# Patient Record
Sex: Male | Born: 1975 | Race: Black or African American | Hispanic: No | Marital: Single | Smoking: Current every day smoker
Health system: Southern US, Community
[De-identification: ages and names within clinical notes are randomized; demographics above are authoritative.]

---

## 2004-01-16 ENCOUNTER — Emergency Department: Payer: Self-pay | Admitting: Emergency Medicine

## 2006-09-27 ENCOUNTER — Emergency Department: Payer: Self-pay | Admitting: Emergency Medicine

## 2009-01-24 ENCOUNTER — Ambulatory Visit: Payer: Self-pay

## 2009-12-14 ENCOUNTER — Emergency Department: Payer: Self-pay | Admitting: Emergency Medicine

## 2012-06-27 ENCOUNTER — Emergency Department: Payer: Self-pay | Admitting: Emergency Medicine

## 2014-11-14 ENCOUNTER — Encounter: Payer: Self-pay | Admitting: Emergency Medicine

## 2014-11-14 ENCOUNTER — Emergency Department
Admission: EM | Admit: 2014-11-14 | Discharge: 2014-11-14 | Disposition: A | Discharge status: Home / Self Care | Payer: BLUE CROSS/BLUE SHIELD | Attending: Emergency Medicine | Admitting: Emergency Medicine

## 2014-11-14 ENCOUNTER — Emergency Department: Payer: BLUE CROSS/BLUE SHIELD

## 2014-11-14 DIAGNOSIS — Z720 Tobacco use: Secondary | ICD-10-CM | POA: Diagnosis not present

## 2014-11-14 DIAGNOSIS — W228XXA Striking against or struck by other objects, initial encounter: Secondary | ICD-10-CM | POA: Diagnosis not present

## 2014-11-14 DIAGNOSIS — Y906 Blood alcohol level of 120-199 mg/100 ml: Secondary | ICD-10-CM | POA: Diagnosis not present

## 2014-11-14 DIAGNOSIS — Y9289 Other specified places as the place of occurrence of the external cause: Secondary | ICD-10-CM | POA: Diagnosis not present

## 2014-11-14 DIAGNOSIS — S0003XA Contusion of scalp, initial encounter: Secondary | ICD-10-CM

## 2014-11-14 DIAGNOSIS — F10120 Alcohol abuse with intoxication, uncomplicated: Secondary | ICD-10-CM | POA: Diagnosis not present

## 2014-11-14 DIAGNOSIS — Y9389 Activity, other specified: Secondary | ICD-10-CM | POA: Insufficient documentation

## 2014-11-14 DIAGNOSIS — S0083XA Contusion of other part of head, initial encounter: Secondary | ICD-10-CM

## 2014-11-14 DIAGNOSIS — Y998 Other external cause status: Secondary | ICD-10-CM | POA: Diagnosis not present

## 2014-11-14 DIAGNOSIS — R51 Headache: Secondary | ICD-10-CM | POA: Diagnosis present

## 2014-11-14 DIAGNOSIS — F10220 Alcohol dependence with intoxication, uncomplicated: Secondary | ICD-10-CM

## 2014-11-14 LAB — BASIC METABOLIC PANEL
ANION GAP: 11 (ref 5–15)
BUN: 23 mg/dL — ABNORMAL HIGH (ref 6–20)
CO2: 25 mmol/L (ref 22–32)
Calcium: 8.6 mg/dL — ABNORMAL LOW (ref 8.9–10.3)
Chloride: 106 mmol/L (ref 101–111)
Creatinine, Ser: 1.12 mg/dL (ref 0.61–1.24)
GFR calc Af Amer: >60 mL/min (ref >60)
GFR calc non Af Amer: >60 mL/min (ref >60)
Glucose, Bld: 141 mg/dL — ABNORMAL HIGH (ref 65–99)
POTASSIUM: 3.5 mmol/L (ref 3.5–5.1)
SODIUM: 142 mmol/L (ref 135–145)

## 2014-11-14 LAB — URINALYSIS COMPLETE WITH MICROSCOPIC (ARMC ONLY)
BACTERIA UA: NONE SEEN
BILIRUBIN URINE: NEGATIVE
Glucose, UA: NEGATIVE mg/dL
Leukocytes, UA: NEGATIVE
NITRITE: NEGATIVE
PROTEIN: NEGATIVE mg/dL
RBC / HPF: NONE SEEN RBC/hpf (ref 0–5)
SPECIFIC GRAVITY, URINE: 1.021 (ref 1.005–1.030)
pH: 5 (ref 5.0–8.0)

## 2014-11-14 LAB — URINE DRUG SCREEN, QUALITATIVE (ARMC ONLY)
AMPHETAMINES, UR SCREEN: NOT DETECTED
Barbiturates, Ur Screen: NOT DETECTED
Benzodiazepine, Ur Scrn: NOT DETECTED
Cannabinoid 50 Ng, Ur ~~LOC~~: NOT DETECTED
Cocaine Metabolite,Ur ~~LOC~~: NOT DETECTED
MDMA (Ecstasy)Ur Screen: NOT DETECTED
METHADONE SCREEN, URINE: NOT DETECTED
Opiate, Ur Screen: NOT DETECTED
Phencyclidine (PCP) Ur S: NOT DETECTED
TRICYCLIC, UR SCREEN: NOT DETECTED

## 2014-11-14 LAB — CBC WITH DIFFERENTIAL/PLATELET
BASOS ABS: 0 10*3/uL (ref 0–0.1)
Basophils Relative: 1 %
EOS ABS: 0 10*3/uL (ref 0–0.7)
Eosinophils Relative: 0 %
HEMATOCRIT: 38.5 % — AB (ref 40.0–52.0)
Hemoglobin: 13.2 g/dL (ref 13.0–18.0)
LYMPHS ABS: 1.3 10*3/uL (ref 1.0–3.6)
Lymphocytes Relative: 19 %
MCH: 32.1 pg (ref 26.0–34.0)
MCHC: 34.2 g/dL (ref 32.0–36.0)
MCV: 93.7 fL (ref 80.0–100.0)
Monocytes Absolute: 0.2 10*3/uL (ref 0.2–1.0)
Monocytes Relative: 3 %
Neutro Abs: 5.6 10*3/uL (ref 1.4–6.5)
Neutrophils Relative %: 77 %
PLATELETS: 246 10*3/uL (ref 150–440)
RBC: 4.11 MIL/uL — ABNORMAL LOW (ref 4.40–5.90)
RDW: 13.5 % (ref 11.5–14.5)
WBC: 7.2 10*3/uL (ref 3.8–10.6)

## 2014-11-14 LAB — ETHANOL: Alcohol, Ethyl (B): 198 mg/dL — ABNORMAL HIGH (ref <5)

## 2014-11-14 MED ORDER — SODIUM CHLORIDE 0.9 % IV BOLUS (SEPSIS)
1000.0000 mL | Freq: Once | INTRAVENOUS | Status: AC
  Administered 2014-11-14: 1000 mL via INTRAVENOUS

## 2014-11-14 NOTE — ED Provider Notes (Signed)
[facility] Emergency Department Provider Note  ____________________________________________  Time seen:  1:33 PM  I have reviewed the triage vital signs and the nursing notes.   HISTORY  Chief Complaint Headache   HPI Jared Carrillo is a 39 y.o. male is here with complaint of headache. History is partially given by a male visitor. Patient states that he hit his head last night but cannot tell what he hit his head on or why he hit his head on anything. He is unaware if he tripped, fell, or had loss of consciousness. He does recall drinking but is unaware of how much he drank. He states today he continues to have a headache. He reports being unable to drive quickly today or perform any of his normal functions. His friend states that he is having trouble waking this morning. Patient verbalized he still feels drunk at this time. He does have an abrasion to his right forehead without active bleeding. He states there is some neck pain involved as well as his headache. He denies any paresthesias to his extremities. Currently he complains of some visual difficulty but is unable to verbalize exactly what he means. He rates his headache pain as 7 out of 10.   History reviewed. No pertinent past medical history.  There are no active problems to display for this patient.   History reviewed. No pertinent past surgical history.  No current outpatient prescriptions on file.  Allergies Review of patient's allergies indicates not on file.  History reviewed. No pertinent family history.  Social History History  Substance Use Topics  . Smoking status: Current Every Day Smoker  . Smokeless tobacco: Not on file  . Alcohol Use: Not on file    Review of Systems Constitutional: No fever/chills Eyes: Positive visual changes. ENT: No sore throat. Cardiovascular: Denies chest pain. Respiratory: Denies shortness of breath. Gastrointestinal: No abdominal pain.  No nausea,  no vomiting.  Genitourinary: Negative for dysuria. Musculoskeletal: Negative for back pain. Positive neck pain Skin: Negative for rash. Positive abrasion Neurological: Positive for headaches, no focal weakness or numbness.  10-point ROS otherwise negative.  ____________________________________________   PHYSICAL EXAM:  VITAL SIGNS: ED Triage Vitals  Enc Vitals Group     BP 11/14/14 1238 107/57 mmHg     Pulse Rate 11/14/14 1238 83     Resp 11/14/14 1238 18     Temp 11/14/14 1238 97.9 F (36.6 C)     Temp Source 11/14/14 1238 Oral     SpO2 11/14/14 1238 94 %     Weight 11/14/14 1238 160 lb (72.576 kg)     Height 11/14/14 1238  (1.727 m)     Head Cir --      Peak Flow --      Pain Score 11/14/14 1251 7     Pain Loc --      Pain Edu? --      Excl. in GC? --     Constitutional: Alert and oriented. Well appearing and in no acute distress. Eyes: Conjunctivae are normal. PERRL. EOMI. Head: Atraumatic. Nose: No congestion/rhinnorhea. Mouth/Throat: Mucous membranes are moist.   Neck: No stridor.  There is some posterior cervical tenderness on palpation. Hematological/Lymphatic/Immunilogical: No cervical lymphadenopathy. Cardiovascular: Normal rate, regular rhythm. Grossly normal heart sounds.  Good peripheral circulation. Respiratory: Normal respiratory effort.  No retractions. Lungs CTAB. Gastrointestinal: Soft and nontender. No distention. No abdominal bruits. No CVA tenderness. Musculoskeletal: Moves upper extremities without any restriction. No lower extremity tenderness  nor edema.  No joint effusions. No trauma noted to the lower extremities. Neurologic:  Normal speech and language. No gross focal neurologic deficits are appreciated. Gait was not tested. Skin:  Skin is warm. There is a superficial abrasion to the right forehead and upper cheek area without active bleeding. Psychiatric: Mood and affect are normal. Speech and behavior are  normal.  ____________________________________________   LABS (all labs ordered are listed, but only abnormal results are displayed)  Labs Reviewed  BASIC METABOLIC PANEL - Abnormal; Notable for the following:    Glucose, Bld 141 (*)    BUN 23 (*)    Calcium 8.6 (*)    All other components within normal limits  CBC WITH DIFFERENTIAL/PLATELET - Abnormal; Notable for the following:    RBC 4.11 (*)    HCT 38.5 (*)    All other components within normal limits  ETHANOL - Abnormal; Notable for the following:    Alcohol, Ethyl (B) 198 (*)    All other components within normal limits  URINALYSIS COMPLETEWITH MICROSCOPIC ([facility] ONLY) - Abnormal; Notable for the following:    Color, Urine YELLOW (*)    APPearance CLEAR (*)    Ketones, ur 1+ (*)    Hgb urine dipstick 1+ (*)    Squamous Epithelial / LPF 0-5 (*)    All other components within normal limits  URINE DRUG SCREEN, QUALITATIVE ([facility] ONLY)      RADIOLOGY  CT scan of the head per radiologist negative for intracranial abnormalities CT scan cervical spine per radiologist showed degenerative changes without acute bony injury ____________________________________________   PROCEDURES  Procedure(s) performed: None  Critical Care performed: No  ____________________________________________   INITIAL IMPRESSION / ASSESSMENT AND PLAN / ED COURSE  Pertinent labs & imaging results that were available during my care of the patient were reviewed by me and considered in my medical decision making (see chart for details).  IV fluids were started on the patient. Patient was informed that his blood alcohol was 198. Urine drug screen was negative. Patient was informed that blood alcohols this time was probably fatally high at the time that he was drinking. He and his male friend were informed that his head CT did not show any acute injury. ____________________________________________   FINAL CLINICAL IMPRESSION(S) / ED  DIAGNOSES  Final diagnoses:  Facial contusion, initial encounter  Contusion of scalp, initial encounter  Alcohol intoxication with blood level 0.08-0.29, uncomplicated      Tommi Rumps, PA-C 11/14/14 1620

## 2014-11-14 NOTE — Discharge Instructions (Signed)
Alcohol Intoxication Alcohol intoxication occurs when you drink enough alcohol that it affects your ability to function. It can be mild or very severe. Drinking a lot of alcohol in a short time is called binge drinking. This can be very harmful. Drinking alcohol can also be more dangerous if you are taking medicines or other drugs. Some of the effects caused by alcohol may include:  Loss of coordination.  Changes in mood and behavior.  Unclear thinking.  Trouble talking (slurred speech).  Throwing up (vomiting).  Confusion.  Slowed breathing.  Twitching and shaking (seizures).  Loss of consciousness. HOME CARE  Do not drive after drinking alcohol.  Drink enough water and fluids to keep your pee (urine) clear or pale yellow. Avoid caffeine.  Only take medicine as told by your doctor. GET HELP IF:  You throw up (vomit) many times.  You do not feel better after a few days.  You frequently have alcohol intoxication. Your doctor can help decide if you should see a substance use treatment counselor. GET HELP RIGHT AWAY IF:  You become shaky when you stop drinking.  You have twitching and shaking.  You throw up blood. It may look bright red or like coffee grounds.  You notice blood in your poop (bowel movements).  You become lightheaded or pass out (faint). MAKE SURE YOU:   Understand these instructions.  Will watch your condition.  Will get help right away if you are not doing well or get worse. Document Released: 09/19/2007 Document Revised: 12/03/2012 Document Reviewed: 09/05/2012 [facility] Patient Information 2015 [place], [place]. This information is not intended to replace advice given to you by your health care provider. Make sure you discuss any questions you have with your health care provider.   DO NOT DRINK ANY ALCOHOL.   INCREASE FLUIDS THE REST OF THE DAY WATCH ABRASIONS FOR INFECTION.  YOU WILL NEED TO CLEAN THESE AREAS EVERY DAY WITH MILD SOAP
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
## 2014-11-14 NOTE — ED Notes (Signed)
Pt informed of need for urine specimen  

## 2014-11-14 NOTE — ED Notes (Signed)
Pt reports he fell last night and hit head, reports headache and neck pain.

## 2016-01-21 ENCOUNTER — Emergency Department: Payer: BLUE CROSS/BLUE SHIELD

## 2016-01-21 ENCOUNTER — Encounter: Payer: Self-pay | Admitting: Emergency Medicine

## 2016-01-21 ENCOUNTER — Emergency Department
Admission: EM | Admit: 2016-01-21 | Discharge: 2016-01-21 | Disposition: A | Discharge status: Home / Self Care | Payer: BLUE CROSS/BLUE SHIELD | Attending: Student | Admitting: Student

## 2016-01-21 DIAGNOSIS — Y939 Activity, unspecified: Secondary | ICD-10-CM | POA: Diagnosis not present

## 2016-01-21 DIAGNOSIS — F17200 Nicotine dependence, unspecified, uncomplicated: Secondary | ICD-10-CM | POA: Diagnosis not present

## 2016-01-21 DIAGNOSIS — F10120 Alcohol abuse with intoxication, uncomplicated: Secondary | ICD-10-CM | POA: Insufficient documentation

## 2016-01-21 DIAGNOSIS — Y929 Unspecified place or not applicable: Secondary | ICD-10-CM | POA: Insufficient documentation

## 2016-01-21 DIAGNOSIS — S2232XA Fracture of one rib, left side, initial encounter for closed fracture: Secondary | ICD-10-CM

## 2016-01-21 DIAGNOSIS — Y999 Unspecified external cause status: Secondary | ICD-10-CM | POA: Insufficient documentation

## 2016-01-21 DIAGNOSIS — S29001A Unspecified injury of muscle and tendon of front wall of thorax, initial encounter: Secondary | ICD-10-CM | POA: Diagnosis present

## 2016-01-21 DIAGNOSIS — Z23 Encounter for immunization: Secondary | ICD-10-CM | POA: Insufficient documentation

## 2016-01-21 DIAGNOSIS — S0990XA Unspecified injury of head, initial encounter: Secondary | ICD-10-CM | POA: Insufficient documentation

## 2016-01-21 DIAGNOSIS — S0181XA Laceration without foreign body of other part of head, initial encounter: Secondary | ICD-10-CM | POA: Insufficient documentation

## 2016-01-21 DIAGNOSIS — F10920 Alcohol use, unspecified with intoxication, uncomplicated: Secondary | ICD-10-CM

## 2016-01-21 LAB — CBC WITH DIFFERENTIAL/PLATELET
BASOS ABS: 0 10*3/uL (ref 0–0.1)
BASOS PCT: 0 %
Eosinophils Absolute: 0 10*3/uL (ref 0–0.7)
Eosinophils Relative: 0 %
HEMATOCRIT: 38.4 % — AB (ref 40.0–52.0)
HEMOGLOBIN: 13.3 g/dL (ref 13.0–18.0)
LYMPHS PCT: 29 %
Lymphs Abs: 2.8 10*3/uL (ref 1.0–3.6)
MCH: 33.3 pg (ref 26.0–34.0)
MCHC: 34.6 g/dL (ref 32.0–36.0)
MCV: 96.2 fL (ref 80.0–100.0)
Monocytes Absolute: 0.5 10*3/uL (ref 0.2–1.0)
Monocytes Relative: 5 %
NEUTROS ABS: 6.4 10*3/uL (ref 1.4–6.5)
NEUTROS PCT: 66 %
Platelets: 322 10*3/uL (ref 150–440)
RBC: 3.99 MIL/uL — AB (ref 4.40–5.90)
RDW: 13.5 % (ref 11.5–14.5)
WBC: 9.8 10*3/uL (ref 3.8–10.6)

## 2016-01-21 LAB — COMPREHENSIVE METABOLIC PANEL
ALT: 20 U/L (ref 17–63)
AST: 39 U/L (ref 15–41)
Albumin: 4 g/dL (ref 3.5–5.0)
Alkaline Phosphatase: 56 U/L (ref 38–126)
Anion gap: 12 (ref 5–15)
BILIRUBIN TOTAL: 0.6 mg/dL (ref 0.3–1.2)
BUN: 13 mg/dL (ref 6–20)
CO2: 22 mmol/L (ref 22–32)
CREATININE: 1.32 mg/dL — AB (ref 0.61–1.24)
Calcium: 8.6 mg/dL — ABNORMAL LOW (ref 8.9–10.3)
Chloride: 103 mmol/L (ref 101–111)
GFR calc Af Amer: >60 mL/min (ref >60)
GFR calc non Af Amer: >60 mL/min (ref >60)
Glucose, Bld: 94 mg/dL (ref 65–99)
Potassium: 3.3 mmol/L — ABNORMAL LOW (ref 3.5–5.1)
Sodium: 137 mmol/L (ref 135–145)
TOTAL PROTEIN: 7.1 g/dL (ref 6.5–8.1)

## 2016-01-21 LAB — ETHANOL: Alcohol, Ethyl (B): 292 mg/dL — ABNORMAL HIGH (ref <5)

## 2016-01-21 MED ORDER — TETANUS-DIPHTH-ACELL PERTUSSIS 5-2.5-18.5 LF-MCG/0.5 IM SUSP
0.5000 mL | Freq: Once | INTRAMUSCULAR | Status: AC
  Administered 2016-01-21: 0.5 mL via INTRAMUSCULAR
  Filled 2016-01-21: qty 0.5

## 2016-01-21 MED ORDER — OXYCODONE HCL 5 MG PO TABS: 5.0000 mg | ORAL_TABLET | Freq: Four times a day (QID) | ORAL | 0 refills | Status: DC | PRN

## 2016-01-21 MED ORDER — BACITRACIN ZINC 500 UNIT/GM EX OINT
TOPICAL_OINTMENT | CUTANEOUS | Status: AC
  Administered 2016-01-21: 1 via TOPICAL
  Filled 2016-01-21: qty 0.9

## 2016-01-21 MED ORDER — BACITRACIN ZINC 500 UNIT/GM EX OINT
TOPICAL_OINTMENT | Freq: Once | CUTANEOUS | Status: AC
  Administered 2016-01-21: 1 via TOPICAL

## 2016-01-21 MED ORDER — LIDOCAINE-EPINEPHRINE (PF) 1 %-1:200000 IJ SOLN
INTRAMUSCULAR | Status: AC
  Administered 2016-01-21: 30 mL
  Filled 2016-01-21: qty 30

## 2016-01-21 MED ORDER — OXYCODONE HCL 5 MG PO TABS
5.0000 mg | ORAL_TABLET | Freq: Once | ORAL | Status: AC
  Administered 2016-01-21: 5 mg via ORAL
  Filled 2016-01-21: qty 1
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
## 2016-04-23 ENCOUNTER — Emergency Department
Admission: EM | Admit: 2016-04-23 | Discharge: 2016-04-23 | Disposition: A | Discharge status: Home / Self Care | Payer: BLUE CROSS/BLUE SHIELD | Attending: Emergency Medicine | Admitting: Emergency Medicine

## 2016-04-23 ENCOUNTER — Encounter: Payer: Self-pay | Admitting: Emergency Medicine

## 2016-04-23 DIAGNOSIS — F17200 Nicotine dependence, unspecified, uncomplicated: Secondary | ICD-10-CM | POA: Diagnosis not present

## 2016-04-23 DIAGNOSIS — B9789 Other viral agents as the cause of diseases classified elsewhere: Secondary | ICD-10-CM

## 2016-04-23 DIAGNOSIS — R509 Fever, unspecified: Secondary | ICD-10-CM | POA: Diagnosis present

## 2016-04-23 DIAGNOSIS — J069 Acute upper respiratory infection, unspecified: Secondary | ICD-10-CM | POA: Diagnosis not present

## 2016-04-23 NOTE — ED Provider Notes (Signed)
[facility] Emergency Department Provider Note   ____________________________________________   None    (approximate)  I have reviewed the triage vital signs and the nursing notes.   HISTORY  Chief Complaint Fever    HPI Jared Carrillo is a 41 y.o. male is here with complaint of fever and body aches that started week and half to 2 weeks ago. Patient states that he has had "low-grade temp". He denies any nausea, vomiting or diarrhea. Patient denies any ear pain or throat pain. Patient states he has had some congestion and is currently taking Mucinex, Tylenol. He begin taking NyQuil and DayQuil yesterday. He states more than when he first woke up after taking NyQuil last night he felt slightly better. He also states that he has some cough which is now cleared. Patient continues to smoke one half pack cigarettes per day. He denies any previous asthma, bronchitis or pneumonia. Currently he rates his pain as 6/10.   History reviewed. No pertinent past medical history.  There are no active problems to display for this patient.   History reviewed. No pertinent surgical history.  Prior to Admission medications   Not on File    Allergies Patient has no known allergies.  No family history on file.  Social History Social History  Substance Use Topics  . Smoking status: Current Every Day Smoker  . Smokeless tobacco: Never Used  . Alcohol use Yes    Review of Systems Constitutional: Positive fever/chills Eyes: No visual changes. ENT: No sore throat. Cardiovascular: Denies chest pain. Respiratory: Denies shortness of breath. Positive cough. Gastrointestinal: No abdominal pain.  No nausea, no vomiting.  No diarrhea.  No constipation. Genitourinary: Negative for dysuria. Musculoskeletal: Generalized muscle aches. Skin: Negative for rash. Neurological: Negative for headaches, focal weakness or numbness.  10-point ROS otherwise  negative.  ____________________________________________   PHYSICAL EXAM:  VITAL SIGNS: ED Triage Vitals  Enc Vitals Group     BP 04/23/16 0931 120/70     Pulse Rate 04/23/16 0931 84     Resp 04/23/16 0931 16     Temp 04/23/16 0931 99.1 F (37.3 C)     Temp Source 04/23/16 0931 Oral     SpO2 04/23/16 0931 97 %     Weight 04/23/16 0931 150 lb (68 kg)     Height 04/23/16 0931 5\' 8"  (1.727 m)     Head Circumference --      Peak Flow --      Pain Score 04/23/16 0927 6     Pain Loc --      Pain Edu? --      Excl. in GC? --     Constitutional: Alert and oriented. Well appearing and in no acute distress. Eyes: Conjunctivae are normal. PERRL. EOMI. Head: Atraumatic. Nose: No congestion/rhinnorhea.  EACs and TMs are clear bilaterally. Mouth/Throat: Mucous membranes are moist.  Oropharynx non-erythematous.Minimal posterior drainage seen. Neck: No stridor.   Hematological/Lymphatic/Immunilogical: No cervical lymphadenopathy. Cardiovascular: Normal rate, regular rhythm. Grossly normal heart sounds.  Good peripheral circulation. Respiratory: Normal respiratory effort.  No retractions. Lungs CTAB. Gastrointestinal: Soft and nontender. No distention.  Musculoskeletal: Moves upper and lower studies without difficulty. Normal gait was noted. Neurologic:  Normal speech and language. No gross focal neurologic deficits are appreciated. No gait instability. Skin:  Skin is warm, dry and intact. No rash noted. Psychiatric: Mood and affect are normal. Speech and behavior are normal.  ____________________________________________   LABS (all labs ordered are listed, but  only abnormal results are displayed)  Labs Reviewed - No data to display  PROCEDURES  Procedure(s) performed: None  Procedures  Critical Care performed: No  ____________________________________________   INITIAL IMPRESSION / ASSESSMENT AND PLAN / ED COURSE  Pertinent labs & imaging results that were available during  my care of the patient were reviewed by me and considered in my medical decision making (see chart for details).    Clinical Course   Patient was told that this most likely is viral upper respiratory infection. He is to increase fluids. Tylenol or ibuprofen as needed for fever and body aches. He may continue DayQuil and NyQuil as needed. [facility] any [facility] problems.  ____________________________________________   FINAL CLINICAL IMPRESSION(S) / ED DIAGNOSES  Final diagnoses:  Viral URI with cough      NEW MEDICATIONS STARTED DURING THIS VISIT:  Current Discharge Medication List       Note:  This document was prepared using Dragon voice recognition software and may include unintentional dictation errors.    Tommi RumpsRhonda L Teshaun Olarte, PA-C 04/23/16 1049    Sharman CheekPhillip Stafford, MD 04/23/16 [phone]

## 2016-04-23 NOTE — Discharge Instructions (Signed)
Increase fluids. Tylenol or ibuprofen as needed for fever or body aches. Continue DayQuil and NyQuil as directed. He may also continue Mucinex if desired. [facility].

## 2016-04-23 NOTE — ED Notes (Signed)
Vitals done.

## 2016-04-23 NOTE — ED Notes (Signed)
Says sick with uri sx for more than one week.  Sounds congested in head.  Some cough and he has been running fever.  No sore throat.

## 2016-04-23 NOTE — ED Triage Notes (Signed)
Pt reports fever and "achey bones" x1 week. Pt ambulatory to triage desk, A/O, skin warm and dry.

## 2017-03-15 ENCOUNTER — Other Ambulatory Visit: Payer: Self-pay

## 2017-03-15 ENCOUNTER — Emergency Department: Payer: BLUE CROSS/BLUE SHIELD

## 2017-03-15 ENCOUNTER — Emergency Department
Admission: EM | Admit: 2017-03-15 | Discharge: 2017-03-15 | Disposition: A | Discharge status: Home / Self Care | Payer: BLUE CROSS/BLUE SHIELD | Attending: Emergency Medicine | Admitting: Emergency Medicine

## 2017-03-15 DIAGNOSIS — I639 Cerebral infarction, unspecified: Secondary | ICD-10-CM | POA: Diagnosis not present

## 2017-03-15 DIAGNOSIS — F10920 Alcohol use, unspecified with intoxication, uncomplicated: Secondary | ICD-10-CM | POA: Diagnosis not present

## 2017-03-15 DIAGNOSIS — F17210 Nicotine dependence, cigarettes, uncomplicated: Secondary | ICD-10-CM | POA: Diagnosis not present

## 2017-03-15 DIAGNOSIS — R200 Anesthesia of skin: Secondary | ICD-10-CM | POA: Diagnosis not present

## 2017-03-15 LAB — DIFFERENTIAL
BASOS PCT: 1 %
Basophils Absolute: 0 10*3/uL (ref 0–0.1)
EOS PCT: 1 %
Eosinophils Absolute: 0 10*3/uL (ref 0–0.7)
Lymphocytes Relative: 47 %
Lymphs Abs: 2.5 10*3/uL (ref 1.0–3.6)
MONO ABS: 0.5 10*3/uL (ref 0.2–1.0)
MONOS PCT: 9 %
NEUTROS ABS: 2.1 10*3/uL (ref 1.4–6.5)
Neutrophils Relative %: 42 %

## 2017-03-15 LAB — URINE DRUG SCREEN, QUALITATIVE (ARMC ONLY)
Amphetamines, Ur Screen: NOT DETECTED
BARBITURATES, UR SCREEN: NOT DETECTED
BENZODIAZEPINE, UR SCRN: NOT DETECTED
Cannabinoid 50 Ng, Ur ~~LOC~~: NOT DETECTED
Cocaine Metabolite,Ur ~~LOC~~: NOT DETECTED
MDMA (Ecstasy)Ur Screen: NOT DETECTED
METHADONE SCREEN, URINE: NOT DETECTED
OPIATE, UR SCREEN: NOT DETECTED
PHENCYCLIDINE (PCP) UR S: NOT DETECTED
Tricyclic, Ur Screen: NOT DETECTED

## 2017-03-15 LAB — GLUCOSE, CAPILLARY: GLUCOSE-CAPILLARY: 105 mg/dL — AB (ref 65–99)

## 2017-03-15 LAB — COMPREHENSIVE METABOLIC PANEL
ALT: 18 U/L (ref 17–63)
AST: 28 U/L (ref 15–41)
Albumin: 4.1 g/dL (ref 3.5–5.0)
Alkaline Phosphatase: 53 U/L (ref 38–126)
Anion gap: 11 (ref 5–15)
BUN: 10 mg/dL (ref 6–20)
CALCIUM: 9.1 mg/dL (ref 8.9–10.3)
CHLORIDE: 107 mmol/L (ref 101–111)
CO2: 26 mmol/L (ref 22–32)
CREATININE: 0.92 mg/dL (ref 0.61–1.24)
Glucose, Bld: 92 mg/dL (ref 65–99)
Potassium: 3.9 mmol/L (ref 3.5–5.1)
Sodium: 144 mmol/L (ref 135–145)
Total Bilirubin: 0.5 mg/dL (ref 0.3–1.2)
Total Protein: 7.4 g/dL (ref 6.5–8.1)

## 2017-03-15 LAB — CBC
HEMATOCRIT: 39 % — AB (ref 40.0–52.0)
Hemoglobin: 13.4 g/dL (ref 13.0–18.0)
MCH: 32.7 pg (ref 26.0–34.0)
MCHC: 34.3 g/dL (ref 32.0–36.0)
MCV: 95.5 fL (ref 80.0–100.0)
Platelets: 273 10*3/uL (ref 150–440)
RBC: 4.09 MIL/uL — ABNORMAL LOW (ref 4.40–5.90)
RDW: 13.4 % (ref 11.5–14.5)
WBC: 5.2 10*3/uL (ref 3.8–10.6)

## 2017-03-15 LAB — PROTIME-INR
INR: 0.91
Prothrombin Time: 12.2 seconds (ref 11.4–15.2)

## 2017-03-15 LAB — TROPONIN I

## 2017-03-15 LAB — ETHANOL: Alcohol, Ethyl (B): 184 mg/dL — ABNORMAL HIGH (ref <10)

## 2017-03-15 LAB — APTT: aPTT: 27 seconds (ref 24–36)

## 2017-03-15 NOTE — Progress Notes (Signed)
CH responded to a PG for Code Stroke. Pt appeared anxious. No family was present. Pt did not request family notification. EDP and medical team are assessing his status. Pt requested prayer which was provided. CH is available for follow up as desired or needed.    03/15/17 1100  Clinical Encounter Type  Visited With Patient;Health care provider  Visit Type Initial;Spiritual support;Code;ED (Code Stroke)  Referral From Nurse  Consult/Referral To Chaplain  Spiritual Encounters  Spiritual Needs Prayer;Emotional

## 2017-03-15 NOTE — ED Provider Notes (Signed)
[facility] Emergency Department Provider Note ____________________________________________   I have reviewed the triage vital signs and the triage nursing note.  HISTORY  Chief Complaint Code Stroke   Historian Patient  HPI Jared Carrillo is a 41 y.o. male presenting to the ED for evaluation of left face, left arm, and left leg numbness and tingling.  Patient noticed this this morning when he woke up.  Patient states that his last known normal would have been yesterday evening, however he states that he did drink alcohol heavily and does not remember much of the rest of the night, and is not sure when symptoms would have started, but this morning he did notice it.  He is never had neurologic symptoms like this before.  Denies headache or head trauma.  Denies vision changes.  No fevers.  He states that he has had a pinched nerve in his neck on the right in the past, but that is not giving him any trouble.   History reviewed. No pertinent past medical history.  There are no active problems to display for this patient.   History reviewed. No pertinent surgical history.  Prior to Admission medications   Not on File    No Known Allergies  No family history on file.  Social History Social History   Tobacco Use  . Smoking status: Current Every Day Smoker  . Smokeless tobacco: Never Used  Substance Use Topics  . Alcohol use: Yes  . Drug use: No    Review of Systems  Constitutional: Negative for fever. Eyes: Negative for visual changes. ENT: Negative for sore throat. Cardiovascular: Negative for chest pain. Respiratory: Negative for shortness of breath. Gastrointestinal: Negative for abdominal pain, vomiting and diarrhea. Genitourinary: Negative for dysuria. Musculoskeletal: Negative for back pain. Skin: Negative for rash. Neurological: Negative for headache.  ____________________________________________   PHYSICAL EXAM:  VITAL  SIGNS: ED Triage Vitals  Enc Vitals Group     BP 03/15/17 1009 (!) 139/101     Pulse Rate 03/15/17 1009 86     Resp 03/15/17 1009 16     Temp 03/15/17 1009 98.4 F (36.9 C)     Temp Source 03/15/17 1009 Oral     SpO2 03/15/17 1009 98 %     Weight 03/15/17 1007 150 lb (68 kg)     Height 03/15/17 1007 5\' 7"  (1.702 m)     Head Circumference --      Peak Flow --      Pain Score --      Pain Loc --      Pain Edu? --      Excl. in [place]? --      Constitutional: Alert and oriented. Well appearing and in no distress. HEENT   Head: Normocephalic and atraumatic.      Eyes: Conjunctivae are normal. Pupils equal and round.       Ears:         Nose: No congestion/rhinnorhea.   Mouth/Throat: Mucous membranes are moist.   Neck: No stridor.  Nontender cervical spine. Cardiovascular/Chest: Normal rate, regular rhythm.  No murmurs, rubs, or gallops. Respiratory: Normal respiratory effort without tachypnea nor retractions. Breath sounds are clear and equal bilaterally. No wheezes/rales/rhonchi. Gastrointestinal: Soft. No distention, no guarding, no rebound. Nontender.    Genitourinary/rectal:Deferred Musculoskeletal: Nontender with normal range of motion in all extremities. No joint effusions.  No lower extremity tenderness.  No edema. Neurologic: 5 out of 5 strength for tremors.  Nation intact.  No  slurred speech..  Reports paresthesia left face, and all 3 distributions, as well as reporting paresthesia to the entire left arm and the entire left leg.   Skin:  Skin is warm, dry and intact. No rash noted. Psychiatric: Mood and affect are normal. Speech and behavior are normal. Patient exhibits appropriate insight and judgment.   ____________________________________________  LABS (pertinent positives/negatives) I, Governor Rooks, MD the attending physician have reviewed the labs noted below.  Labs Reviewed  CBC - Abnormal; Notable for the following components:      Result Value   RBC  4.09 (*)    HCT 39.0 (*)    All other components within normal limits  GLUCOSE, CAPILLARY - Abnormal; Notable for the following components:   Glucose-Capillary 105 (*)    All other components within normal limits  ETHANOL - Abnormal; Notable for the following components:   Alcohol, Ethyl (B) 184 (*)    All other components within normal limits  PROTIME-INR  APTT  DIFFERENTIAL  COMPREHENSIVE METABOLIC PANEL  TROPONIN I  URINE DRUG SCREEN, QUALITATIVE ([facility] ONLY)  CBG MONITORING, ED    ____________________________________________    EKG I, Governor Rooks, MD, the attending physician have personally viewed and interpreted all ECGs.  80 bpm normal sinus rhythm.  Narrow chest.  Normal axis.  Normal T wave ____________________________________________  RADIOLOGY All Xrays were viewed by me.  Imaging interpreted by Radiologist, and I, Governor Rooks, MD the attending physician have reviewed the radiologist interpretation noted below.  CT head without contrast:IMPRESSION: 1. Normal head CT 2. ASPECTS is 10.  These results were called by telephone at the time of interpretation on 03/15/2017 at 10:23 am to Dr. Governor Rooks , who verbally acknowledged these results.  DG orbits:  IMPRESSION: No evidence of metallic foreign body within the orbits.  MRI brain without contrast:  IMPRESSION: 1. Normal noncontrast MRI appearance of the brain. No acute intracranial abnormality. 2. Partially visible cervical spine degeneration at C3-C4, appears mild. __________________________________________  PROCEDURES  Procedure(s) performed: None  Critical Care performed: None   ____________________________________________  ED COURSE / ASSESSMENT AND PLAN  Pertinent labs & imaging results that were available during my care of the patient were reviewed by me and considered in my medical decision making (see chart for details).    Patient made code stroke due to complaint of left-sided  numbness by triage and sent to CT and Dr. Thad Ranger of neurology paged and came to the bedside immediately.  I took a phone call from radiologist for negative head CT.  On my exam, patient having paresthesia to the left face left arm and left leg without any focal weakness or coordination speech or vision problems.  Patient does still smell of alcohol, alcohol level was added on and was still elevated.  Given head CT negative, it was recommended patient have an MRI.  MRI was negative for acute findings.  Although no certain cause is found for patient's symptoms, does not appear to be acute neurologic emergency.  The rest of his laboratory studies are overall reassuring in terms of not anemic or electrolyte disturbance or kidney failure.  Patient will be discharged home and recommended for primary care as well as neurology follow-up.  CONSULTATIONS: Dr. Thad Ranger, neurology at bedside for code stroke, does not recommend TPA due to unknown time of onset, greater than 12 hours, as well as minor deficit with NIH stroke scale of 1.  Recommends MRI and if negative discharge home.   Patient / Family /  Caregiver informed of clinical course, medical decision-making process, and agree with plan.   I discussed return precautions, follow-up instructions, and discharge instructions with patient and/or family.  Discharge Instructions : You are evaluated for numbness also called paresthesia, and although no certain cause was fine, your exam and evaluation are overall reassuring in the emergency department today.  Return to the emergency department immediately for any new or worsening condition including headache, confusion or altered mental status, fever, skin rash, dizziness or passing out, palpitations, weakness, facial droop, slurring words, trouble finding words, vision changes, or any other symptoms concerning to you.    ___________________________________________   FINAL CLINICAL IMPRESSION(S) /  ED DIAGNOSES   Final diagnoses:  Left sided numbness  Alcoholic intoxication without complication (HCC)      ___________________________________________        Note: This dictation was prepared with Dragon dictation. Any transcriptional errors that result from this process are unintentional    Governor RooksLord, Ivanna Kocak, MD 03/15/17 1420

## 2017-03-15 NOTE — ED Triage Notes (Signed)
Pt states he went to bed at 3am normal and when he woke up around 8am he had left arm and left leg numbness and is having trouble walking/off balance, states he tripped a couple times this morning.

## 2017-03-15 NOTE — Consult Note (Signed)
Referring Physician: Shaune PollackLord    Chief Complaint: Left sided numbness  HPI: Jared Carrillo is an 41 y.o. male with a history of hypertension, noncompliant with medications who presents with complaints of left sided numbness.  The patient reports that he was drinking heavily yesterday and really does not recall when his symptoms started.  Knows that he was at baseline prior to drinking.  Initial NIHSS of 1.    Date last known well: Date: 03/14/2017 Time last known well: Time: 17:00 tPA Given: No: Outside time window  Past medical history: HTN  History reviewed. No pertinent surgical history.  Family history: Mother alive with HTN.  Father deceased.  No family history of stroke  Social History:  reports that he has been smoking.  he has never used smokeless tobacco. He reports that he drinks alcohol. He reports that he does not use drugs.  Allergies: No Known Allergies  Medications: I have reviewed the patient's current medications. Prior to Admission:  Prior to Admission medications   None    ROS: History obtained from the patient  General ROS: negative for - chills, fatigue, fever, night sweats, weight gain or weight loss Psychological ROS: negative for - behavioral disorder, hallucinations, memory difficulties, mood swings or suicidal ideation Ophthalmic ROS: negative for - blurry vision, double vision, eye pain or loss of vision ENT ROS: negative for - epistaxis, nasal discharge, oral lesions, sore throat, tinnitus or vertigo Allergy and Immunology ROS: negative for - hives or itchy/watery eyes Hematological and Lymphatic ROS: negative for - bleeding problems, bruising or swollen lymph nodes Endocrine ROS: negative for - galactorrhea, hair pattern changes, polydipsia/polyuria or temperature intolerance Respiratory ROS: negative for - cough, hemoptysis, shortness of breath or wheezing Cardiovascular ROS: negative for - chest pain, dyspnea on exertion, edema or irregular  heartbeat Gastrointestinal ROS: negative for - abdominal pain, diarrhea, hematemesis, nausea/vomiting or stool incontinence Genito-Urinary ROS: negative for - dysuria, hematuria, incontinence or urinary frequency/urgency Musculoskeletal ROS: negative for - joint swelling or muscular weakness Neurological ROS: as noted in HPI Dermatological ROS: negative for rash and skin lesion changes  Physical Examination: Blood pressure 129/90, pulse 68, temperature 98.4 F (36.9 C), temperature source Oral, resp. rate 18, height 5\' 7"  (1.702 m), weight 68 kg (150 lb), SpO2 100 %.  HEENT-  Normocephalic, no lesions, without obvious abnormality.  Normal external eye and conjunctiva.  Normal TM's bilaterally.  Normal auditory canals and external ears. Normal external nose, mucus membranes and septum.  Normal pharynx. Cardiovascular- S1, S2 normal, pulses palpable throughout   Lungs- chest clear, no wheezing, rales, normal symmetric air entry Abdomen- soft, non-tender; bowel sounds normal; no masses,  no organomegaly Extremities- no edema Lymph-no adenopathy palpable Musculoskeletal-no joint tenderness, deformity or swelling Skin-warm and dry, no hyperpigmentation, vitiligo, or suspicious lesions  Neurological Examination   Mental Status: Alert, oriented, thought content appropriate.  Speech fluent without evidence of aphasia.  Able to follow 3 step commands without difficulty. Cranial Nerves: II: Discs flat bilaterally; Visual fields grossly normal, pupils equal, round, reactive to light and accommodation III,IV, VI: ptosis not present, extra-ocular motions intact bilaterally V,VII: smile symmetric, facial light touch sensation decreased on the face VIII: hearing normal bilaterally IX,X: gag reflex present XI: bilateral shoulder shrug XII: midline tongue extension Motor: Right : Upper extremity   5/5    Left:     Upper extremity   5/5  Lower extremity   5/5     Lower extremity   5/5 Tone and  bulk:normal tone throughout; no atrophy noted Sensory: Pinprick and light touch decreased on the left upper and lower extremity Deep Tendon Reflexes: 2+ and symmetric with absent AJ's bilaterally Plantars: Right: downgoing   Left: downgoing Cerebellar: Normal finger-to-nose and normal heel-to-shin testing bilaterally Gait: normal gait and station    Laboratory Studies:  Basic Metabolic Panel: Recent Labs  Lab 03/15/17 1023  NA 144  K 3.9  CL 107  CO2 26  GLUCOSE 92  BUN 10  CREATININE 0.92  CALCIUM 9.1    Liver Function Tests: Recent Labs  Lab 03/15/17 1023  AST 28  ALT 18  ALKPHOS 53  BILITOT 0.5  PROT 7.4  ALBUMIN 4.1   No results for input(s): LIPASE, AMYLASE in the last 168 hours. No results for input(s): AMMONIA in the last 168 hours.  CBC: Recent Labs  Lab 03/15/17 1023  WBC 5.2  NEUTROABS 2.1  HGB 13.4  HCT 39.0*  MCV 95.5  PLT 273    Cardiac Enzymes: Recent Labs  Lab 03/15/17 1023  TROPONINI <0.03    BNP: Invalid input(s): POCBNP  CBG: Recent Labs  Lab 03/15/17 1022  GLUCAP 105*    Microbiology: No results found for this or any previous visit.  Coagulation Studies: Recent Labs    03/15/17 1023  LABPROT 12.2  INR 0.91    Urinalysis: No results for input(s): COLORURINE, LABSPEC, PHURINE, GLUCOSEU, HGBUR, BILIRUBINUR, KETONESUR, PROTEINUR, UROBILINOGEN, NITRITE, LEUKOCYTESUR in the last 168 hours.  Invalid input(s): APPERANCEUR  Lipid Panel: No results found for: CHOL, TRIG, HDL, CHOLHDL, VLDL, LDLCALC  HgbA1C: No results found for: HGBA1C  Urine Drug Screen:      Component Value Date/Time   LABOPIA NONE DETECTED 11/14/2014 1455   COCAINSCRNUR NONE DETECTED 11/14/2014 1455   LABBENZ NONE DETECTED 11/14/2014 1455   AMPHETMU NONE DETECTED 11/14/2014 1455   THCU NONE DETECTED 11/14/2014 1455   LABBARB NONE DETECTED 11/14/2014 1455    Alcohol Level: No results for input(s): ETH in the last 168 hours.  Other  results: EKG: normal sinus rhythm at 88 bpm.  Imaging: Dg Eye Foreign Body  Result Date: 03/15/2017 CLINICAL DATA:  Metal working/exposure; clearance prior to MRI EXAM: ORBITS FOR FOREIGN BODY - 2 VIEW COMPARISON:  None. FINDINGS: There is no evidence of metallic foreign body within the orbits. No significant bone abnormality identified. IMPRESSION: No evidence of metallic foreign body within the orbits. Electronically Signed   By: Obie Dredge M.D.   On: 03/15/2017 12:07   Mr Brain Wo Contrast  Result Date: 03/15/2017 CLINICAL DATA:  41 year old male was normal at 0 300 hours, woke at 0800 hours with left extremity numbness, loss of balance and difficulty walking. EXAM: MRI HEAD WITHOUT CONTRAST TECHNIQUE: Multiplanar, multiecho pulse sequences of the brain and surrounding structures were obtained without intravenous contrast. COMPARISON:  Head CT without contrast 1014 hours today, and earlier. FINDINGS: Brain: No restricted diffusion to suggest acute infarction. No midline shift, mass effect, evidence of mass lesion, ventriculomegaly (small cavum septum pellucidum, normal variant), extra-axial collection or acute intracranial hemorrhage. Cervicomedullary junction and pituitary are within normal limits. Developmental venous anomaly in the right cerebellar hemisphere (normal variant). Normal for age gray and white matter signal throughout the brain. No cortical encephalomalacia or chronic cerebral blood products. Vascular: Major intracranial vascular flow voids are preserved. Skull and upper cervical spine: Partially visible cervical spine disc degeneration at C3-C4. Normal bone marrow signal. Sinuses/Orbits: Normal orbits soft tissues. Visualized paranasal sinuses and mastoids are stable and  well pneumatized. Other: Visible internal auditory structures appear normal. Scalp and face soft tissues appear negative. IMPRESSION: 1. Normal noncontrast MRI appearance of the brain. No acute intracranial  abnormality. 2. Partially visible cervical spine degeneration at C3-C4, appears mild. Electronically Signed   By: Odessa FlemingH  Hall M.D.   On: 03/15/2017 12:59   Ct Head Code Stroke Wo Contrast  Result Date: 03/15/2017 CLINICAL DATA:  Code stroke. Wake up stroke 2 hours 20 minutes ago with left arm and leg numbness. EXAM: CT HEAD WITHOUT CONTRAST TECHNIQUE: Contiguous axial images were obtained from the base of the skull through the vertex without intravenous contrast. COMPARISON:  01/21/2016 FINDINGS: Brain: Normal appearance without evidence of atrophy, old or acute infarction, mass lesion, hemorrhage, hydrocephalus or extra-axial collection. Vascular: No abnormal vascular finding. Skull: Normal Sinuses/Orbits: Normal Other: None ASPECTS (Alberta Stroke Program Early CT Score) - Ganglionic level infarction (caudate, lentiform nuclei, internal capsule, insula, M1-M3 cortex): 7 - Supraganglionic infarction (M4-M6 cortex): 3 Total score (0-10 with 10 being normal): 10 IMPRESSION: 1. Normal head CT 2. ASPECTS is 10. These results were called by telephone at the time of interpretation on 03/15/2017 at 10:23 am to Dr. Governor RooksEBECCA LORD , who verbally acknowledged these results. Electronically Signed   By: Paulina FusiMark  Shogry M.D.   On: 03/15/2017 10:25    Assessment: 41 y.o. male presenting with left sided numbness, outside of the window for tPA.  Hypertensive and noncompliant with medications.  Head CT reviewed and shows no acute changes.  Concern is for a small lacune related to poorly controlled HTN.  Further work up recommended.    Stroke Risk Factors - hypertension and smoking  Plan: 1. HgbA1c, fasting lipid panel 2. MRI of the brain without contrast  Case discussed with Dr. Zenaida DeedLord  Nikky Duba, MD Neurology [phone] 03/15/2017, 1:08 PM  Addendum: MRI of the brain reviewed and shows no acute infarct.   Recommendations: 1. ASA 81mg  daily 2. Smoking cessation counseling 3. BP control 4. Patient to  follow up with PCP on an outpatient basis  Thana FarrLeslie Trica Usery, MD Neurology [phone]

## 2017-03-15 NOTE — ED Notes (Signed)
Code Stroke called to [phone]  1009

## 2017-03-15 NOTE — Discharge Instructions (Signed)
You are evaluated for numbness also called paresthesia, and although no certain cause was fine, your exam and evaluation are overall reassuring in the emergency department today.  Return to the emergency department immediately for any new or worsening condition including headache, confusion or altered mental status, fever, skin rash, dizziness or passing out, palpitations, weakness, facial droop, slurring words, trouble finding words, vision changes, or any other symptoms concerning to you.  Take over the counter 81mg  Aspirin daily.

## 2018-11-09 ENCOUNTER — Ambulatory Visit
Admission: EM | Admit: 2018-11-09 | Discharge: 2018-11-09 | Disposition: A | Discharge status: Home / Self Care | Payer: BC Managed Care – PPO | Attending: Family Medicine | Admitting: Family Medicine

## 2018-11-09 ENCOUNTER — Other Ambulatory Visit: Payer: Self-pay

## 2018-11-09 ENCOUNTER — Encounter: Payer: Self-pay | Admitting: Emergency Medicine

## 2018-11-09 DIAGNOSIS — K047 Periapical abscess without sinus: Secondary | ICD-10-CM | POA: Diagnosis not present

## 2018-11-09 DIAGNOSIS — K0889 Other specified disorders of teeth and supporting structures: Secondary | ICD-10-CM | POA: Diagnosis not present

## 2018-11-09 MED ORDER — PENICILLIN V POTASSIUM 500 MG PO TABS: 500.0000 mg | ORAL_TABLET | Freq: Three times a day (TID) | ORAL | 0 refills | Status: DC

## 2018-11-09 MED ORDER — LIDOCAINE VISCOUS HCL 2 % MT SOLN: 15.0000 mL | OROMUCOSAL | 0 refills | Status: DC | PRN

## 2018-11-09 MED ORDER — HYDROCODONE-ACETAMINOPHEN 5-325 MG PO TABS: ORAL_TABLET | ORAL | 0 refills | Status: DC

## 2018-11-09 NOTE — ED Provider Notes (Signed)
[facility]    CSN: [id number] Arrival date & time: 11/09/18  1254     History   Chief Complaint Chief Complaint  Patient presents with  . Dental Pain    HPI Jared Carrillo is a 43 y.o. male.   43 yo male with a c/o left lower molar tooth pain for the past 2 days. States he has bad teeth. Denies any fevers, chills, injuries. Has tried otc analgesics without relief.   Dental Pain   History reviewed. No pertinent past medical history.  There are no active problems to display for this patient.   History reviewed. No pertinent surgical history.     Home Medications    Prior to Admission medications   Medication Sig Start Date End Date Taking? Authorizing Provider  HYDROcodone-acetaminophen (NORCO/VICODIN) 5-325 MG tablet 1-2 tabs po bid prn 11/09/18   Norval Gable, MD  lidocaine (XYLOCAINE) 2 % solution Use as directed 15 mLs in the mouth or throat as needed for mouth pain. 11/09/18   Norval Gable, MD  penicillin v potassium (VEETID) 500 MG tablet Take 1 tablet (500 mg total) by mouth 3 (three) times daily. 11/09/18   Norval Gable, MD    Family History Family History  Problem Relation Age of Onset  . Hypertension Mother     Social History Social History   Tobacco Use  . Smoking status: Current Every Day Smoker    Types: Cigarettes  . Smokeless tobacco: Never Used  Substance Use Topics  . Alcohol use: Yes  . Drug use: No     Allergies   Patient has no known allergies.   Review of Systems Review of Systems   Physical Exam Triage Vital Signs ED Triage Vitals  Enc Vitals Group     BP 11/09/18 1311 (!) 152/84     Pulse Rate 11/09/18 1311 69     Resp 11/09/18 1311 14     Temp 11/09/18 1311 98.6 F (37 C)     Temp Source 11/09/18 1311 Oral     SpO2 11/09/18 1311 100 %     Weight 11/09/18 1308 155 lb (70.3 kg)     Height 11/09/18 1308 5\' 7"  (1.702 m)     Head Circumference --      Peak Flow --      Pain Score 11/09/18 1308 9    Pain Loc --      Pain Edu? --      Excl. in [place]? --    No data found.  Updated Vital Signs BP (!) 152/84 (BP Location: Left Arm)   Pulse 69   Temp 98.6 F (37 C) (Oral)   Resp 14   Ht 5\' 7"  (1.702 m)   Wt 70.3 kg   SpO2 100%   BMI 24.28 kg/m   Visual Acuity Right Eye Distance:   Left Eye Distance:   Bilateral Distance:    Right Eye Near:   Left Eye Near:    Bilateral Near:     Physical Exam Vitals signs and nursing note reviewed.  Constitutional:      General: He is not in acute distress.    Appearance: He is not toxic-appearing or diaphoretic.  HENT:     Mouth/Throat:     Dentition: Abnormal dentition. Dental tenderness and dental caries present.  Neurological:     Mental Status: He is alert.      UC Treatments / Results  Labs (all labs ordered are listed, but only abnormal results  are displayed) Labs Reviewed - No data to display  EKG   Radiology No results found.  Procedures Procedures (including critical care time)  Medications Ordered in UC Medications - No data to display  Initial Impression / Assessment and Plan / UC Course  I have reviewed the triage vital signs and the nursing notes.  Pertinent labs & imaging results that were available during my care of the patient were reviewed by me and considered in my medical decision making (see chart for details).      Final Clinical Impressions(s) / UC Diagnoses   Final diagnoses:  Pain, dental  Dental infection    ED Prescriptions    Medication Sig Dispense Auth. Provider   penicillin v potassium (VEETID) 500 MG tablet Take 1 tablet (500 mg total) by mouth 3 (three) times daily. 30 tablet Asuka Dusseau, Pamala Hurryrlando, MD   lidocaine (XYLOCAINE) 2 % solution Use as directed 15 mLs in the mouth or throat as needed for mouth pain. 100 mL Payton Mccallumonty, Loletta Harper, MD   HYDROcodone-acetaminophen (NORCO/VICODIN) 5-325 MG tablet 1-2 tabs po bid prn 6 tablet Payton Mccallumonty, Sheryn Aldaz, MD      1. diagnosis reviewed with patient  2. rx as per orders above; reviewed possible side effects, interactions, risks and benefits  3. Recommend supportive treatment with otc analgesics prn 4. Follow up with dentist 5. Follow-up prn if symptoms worsen or don't improve   Controlled Substance Prescriptions [place] Controlled Substance Registry consulted? Not Applicable   Payton Mccallumonty, Chele Cornell, MD 11/09/18 1335

## 2018-11-09 NOTE — ED Triage Notes (Signed)
Patient c/o left bottom tooth pain that started on Saturday.  Patient denies fevers.

## 2018-11-20 IMAGING — MR MR HEAD W/O CM
10 series · 48 of 48 positions shown · non-contrast
Comparison: Head CT without contrast 0604 hours today, and earlier.

CLINICAL DATA: 40-year-old male was normal at 0 300 hours, woke at
6566 hours with left extremity numbness, loss of balance and
difficulty walking.

EXAM:
MRI HEAD WITHOUT CONTRAST
TECHNIQUE: Multiplanar, multiecho pulse sequences of the brain and surrounding
structures were obtained without intravenous contrast.

[Series 2: T1 · sagittal · 5.0mm · 0.45mm/px · 2 of 25 slices shown (1 of 2)]
[im 1/25]
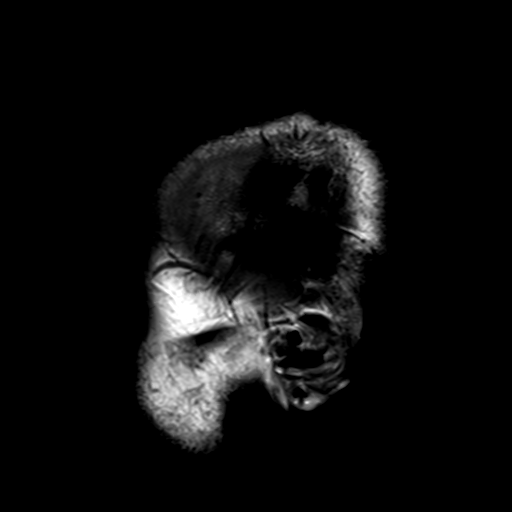
[im 25/25]
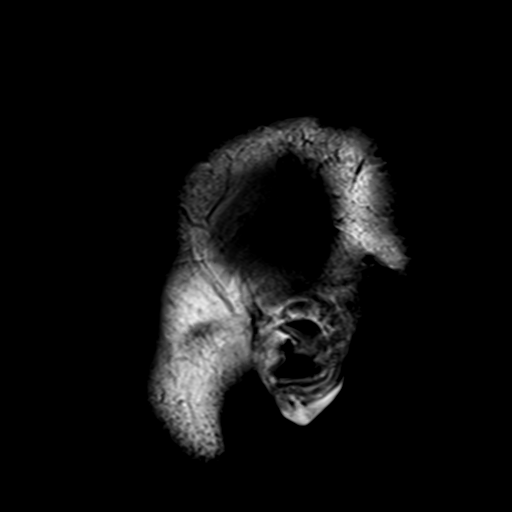

[Series 4: DWI · axial · 3.0mm · 1.80mm/px · z∈[-80,+82]mm · 5 of 52 slices shown (1 of 2)]
[im 1/52]
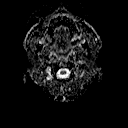
[im 13/52]
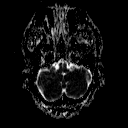
[im 26/52]
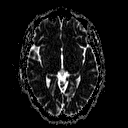
[im 39/52]
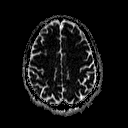
[im 52/52]
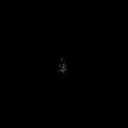

[Series 6: DWI · coronal · 3.0mm · 1.80mm/px · 4 of 47 slices shown (2 of 2)]
[im 1/47]
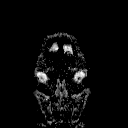
[im 16/47]
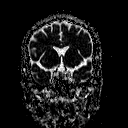
[im 31/47]
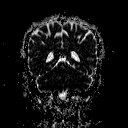
[im 47/47]
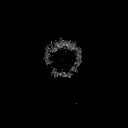

[Series 7: T2 · axial · 5.0mm · 0.60mm/px · z∈[-77,+79]mm · 2 of 25 slices shown (1 of 3)]
[im 1/25]
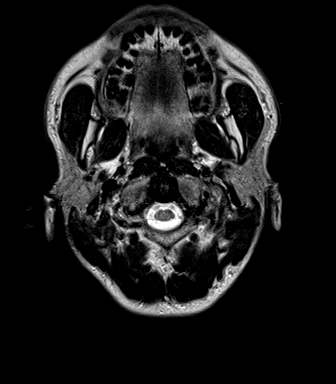
[im 25/25]
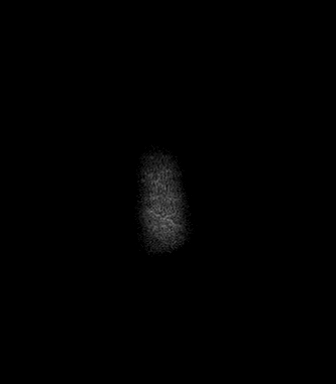

[Series 8: FLAIR · axial · 3.0mm · 0.45mm/px · z∈[-77,+79]mm · 5 of 53 slices shown]
[im 1/53]
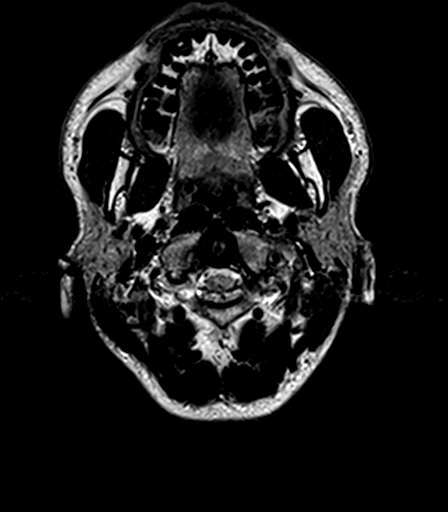
[im 14/53]
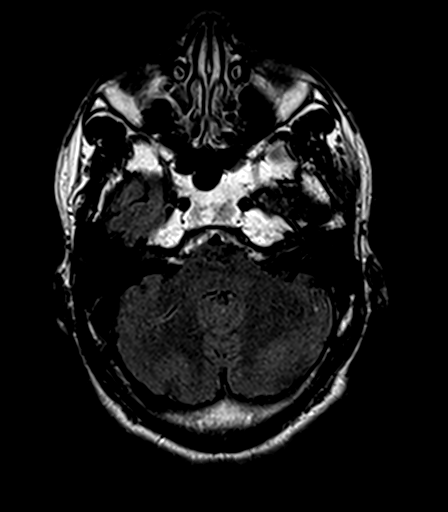
[im 27/53]
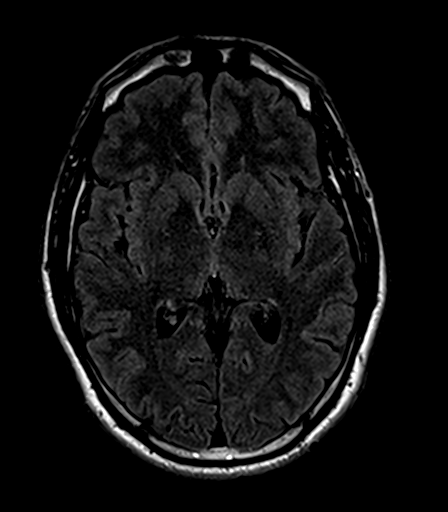
[im 40/53]
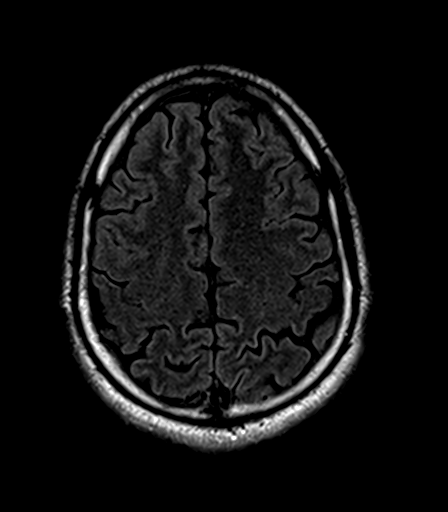
[im 53/53]
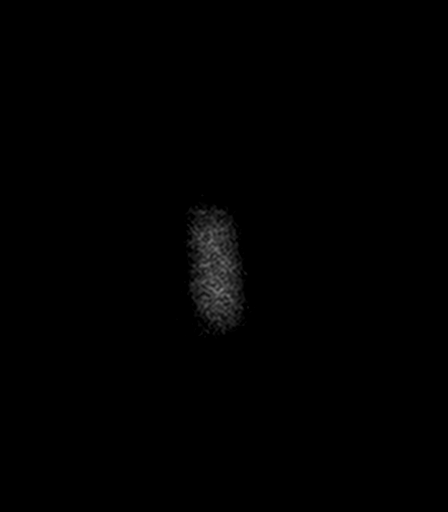

[Series 9: T1 · axial · 1.0mm · 1.00mm/px · z∈[-87,+87]mm · 16 of 176 slices shown (2 of 2)]
[im 1/176]
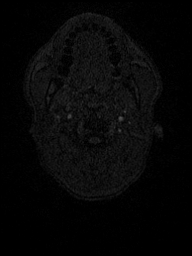
[im 12/176]
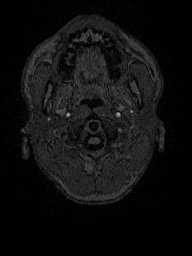
[im 24/176]
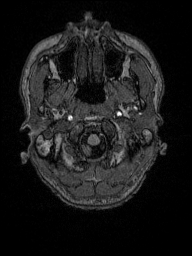
[im 36/176]
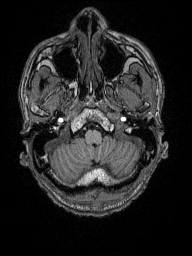
[im 47/176]
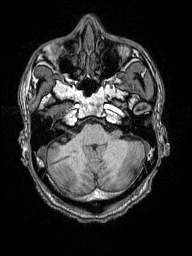
[im 59/176]
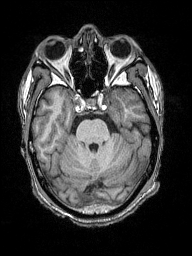
[im 71/176]
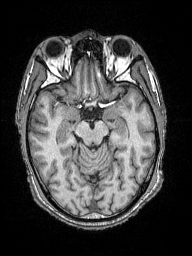
[im 82/176]
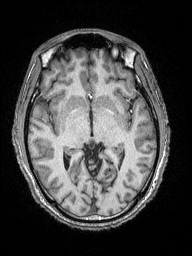
[im 94/176]
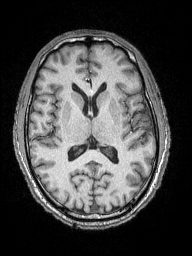
[im 106/176]
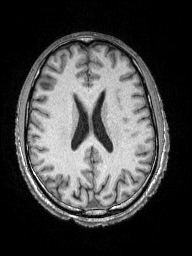
[im 117/176]
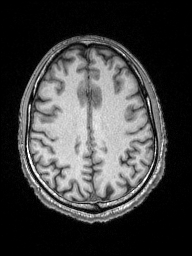
[im 129/176]
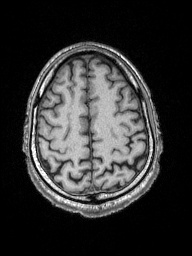
[im 141/176]
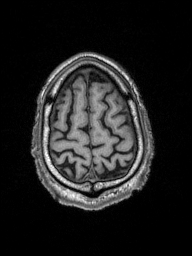
[im 152/176]
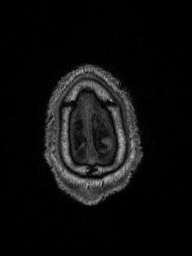
[im 164/176]
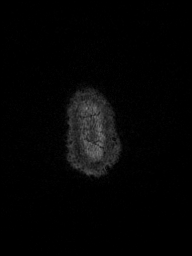
[im 176/176]
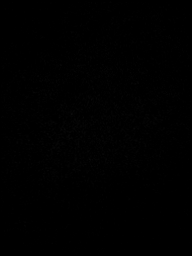

[Series 10: T2 · axial · 5.0mm · 0.45mm/px · z∈[-77,+79]mm · 2 of 25 slices shown (2 of 3)]
[im 1/25]
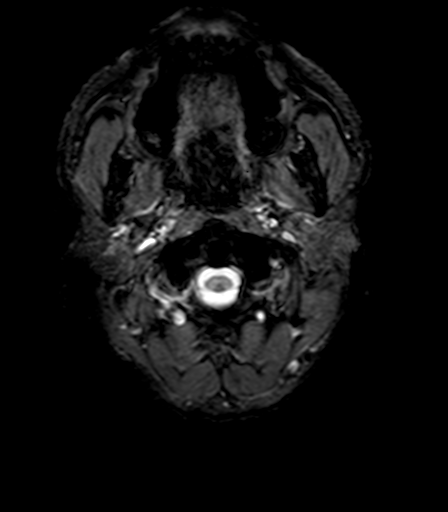
[im 25/25]
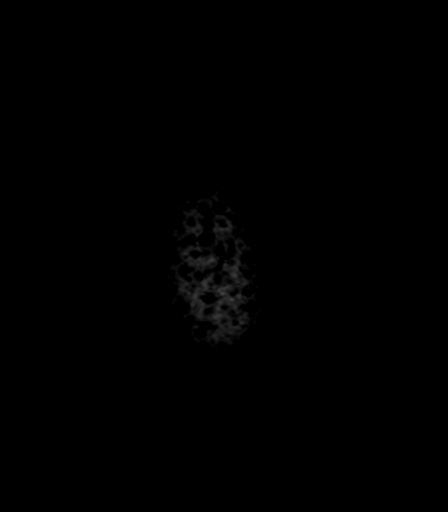

[Series 11: T2 · coronal · 5.0mm · 0.49mm/px · 3 of 29 slices shown (3 of 3)]
[im 1/29]
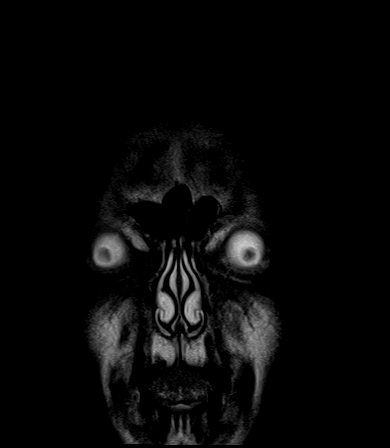
[im 15/29]
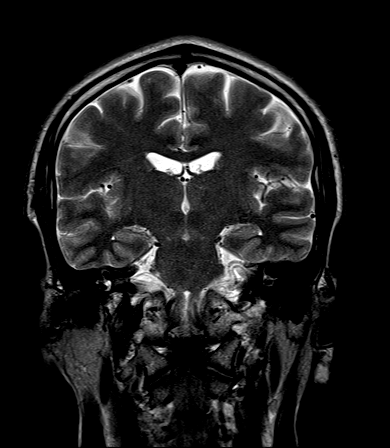
[im 29/29]
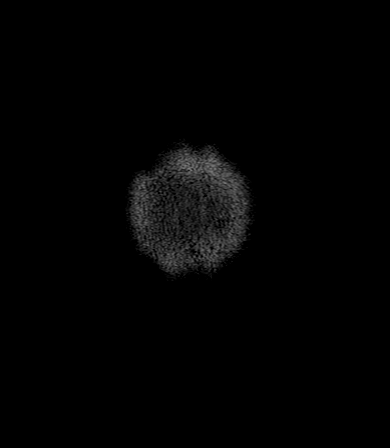

[Series 100: ax (id) · axial · 3.0mm · 1.80mm/px · z∈[-80,+82]mm · 5 of 55 slices shown]
[im 1/55]
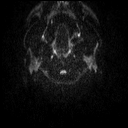
[im 14/55]
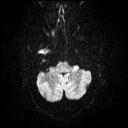
[im 28/55]
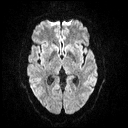
[im 41/55]
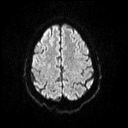
[im 55/55]
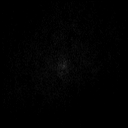

[Series 101: cor (id) · coronal · 3.0mm · 1.80mm/px · 4 of 46 slices shown]
[im 1/46]
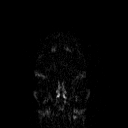
[im 16/46]
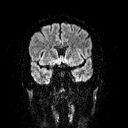
[im 31/46]
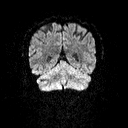
[im 46/46]
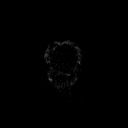

[48 of 48 positions shown; findings below may reference images not displayed]

FINDINGS: Brain: No restricted diffusion to suggest acute infarction. No
midline shift, mass effect, evidence of mass lesion,
ventriculomegaly (small cavum septum pellucidum, normal variant),
extra-axial collection or acute intracranial hemorrhage.
Cervicomedullary junction and pituitary are within normal limits.

Developmental venous anomaly in the right cerebellar hemisphere
(normal variant). Normal for age gray and white matter signal
throughout the brain. No cortical encephalomalacia or chronic
cerebral blood products.

Vascular: Major intracranial vascular flow voids are preserved.

Skull and upper cervical spine: Partially visible cervical spine
disc degeneration at C3-C4. Normal bone marrow signal.

Sinuses/Orbits: Normal orbits soft tissues.

Visualized paranasal sinuses and mastoids are stable and well
pneumatized.

Other: Visible internal auditory structures appear normal. Scalp and
face soft tissues appear negative.
IMPRESSION: 1. Normal noncontrast MRI appearance of the brain. No acute
intracranial abnormality.
2. Partially visible cervical spine degeneration at C3-C4, appears
mild.

## 2019-04-02 ENCOUNTER — Ambulatory Visit
Admission: EM | Admit: 2019-04-02 | Discharge: 2019-04-02 | Disposition: A | Discharge status: Home / Self Care | Payer: BC Managed Care – PPO | Attending: Family Medicine | Admitting: Family Medicine

## 2019-04-02 ENCOUNTER — Other Ambulatory Visit: Payer: Self-pay

## 2019-04-02 ENCOUNTER — Encounter: Payer: Self-pay | Admitting: Emergency Medicine

## 2019-04-02 DIAGNOSIS — Z20822 Contact with and (suspected) exposure to covid-19: Secondary | ICD-10-CM

## 2019-04-02 DIAGNOSIS — Z20828 Contact with and (suspected) exposure to other viral communicable diseases: Secondary | ICD-10-CM | POA: Diagnosis not present

## 2019-04-02 NOTE — ED Triage Notes (Signed)
Patient here for COVID testing. States his girlfriend tested positive Tuesday. He denies any symptoms other than his left eye was red and draining on Sunday but resolved.

## 2019-04-02 NOTE — ED Provider Notes (Signed)
[facility]    CSN: [id number] Arrival date & time: 04/02/19  1530  History   Chief Complaint Chief Complaint  Patient presents with  . COVID testing   HPI  43 year old male presents for Covid testing.  Patient reports that his significant other tested positive recently.  She was just seen here.  Patient reports that he had some left eye redness on Sunday but this resolved.  He denies fever.  Denies cough.  Denies shortness of breath.  No respiratory symptoms.  Patient desires testing today.  No other complaints or concerns this time.  PMH, Surgical Hx, Family Hx, Social History reviewed and updated as below.  PMH: Tobacco abuse.  No surgical Hx.  Home Medications    Prior to Admission medications   Not on File   Family History Family History  Problem Relation Age of Onset  . Hypertension Mother    Social History Social History   Tobacco Use  . Smoking status: Current Every Day Smoker    Types: Cigarettes  . Smokeless tobacco: Never Used  Substance Use Topics  . Alcohol use: Yes  . Drug use: No     Allergies   Patient has no known allergies.   Review of Systems Review of Systems  Constitutional: Negative.   HENT: Negative.   Respiratory: Negative.    Physical Exam Triage Vital Signs ED Triage Vitals  Enc Vitals Group     BP 04/02/19 1549 137/89     Pulse Rate 04/02/19 1549 80     Resp 04/02/19 1549 18     Temp 04/02/19 1549 99.9 F (37.7 C)     Temp Source 04/02/19 1549 Oral     SpO2 04/02/19 1549 98 %     Weight 04/02/19 1548 150 lb (68 kg)     Height 04/02/19 1548 5\' 7"  (1.702 m)     Head Circumference --      Peak Flow --      Pain Score 04/02/19 1548 0     Pain Loc --      Pain Edu? --      Excl. in [place]? --    Updated Vital Signs BP 137/89 (BP Location: Right Arm)   Pulse 80   Temp 99.9 F (37.7 C) (Oral)   Resp 18   Ht 5\' 7"  (1.702 m)   Wt 68 kg   SpO2 98%   BMI 23.49 kg/m   Visual Acuity Right Eye Distance:    Left Eye Distance:   Bilateral Distance:    Right Eye Near:   Left Eye Near:    Bilateral Near:     Physical Exam Vitals and nursing note reviewed.  Constitutional:      General: He is not in acute distress.    Appearance: Normal appearance. He is not ill-appearing.  HENT:     Head: Normocephalic and atraumatic.  Eyes:     General:        Right eye: No discharge.        Left eye: No discharge.     Conjunctiva/sclera: Conjunctivae normal.  Cardiovascular:     Rate and Rhythm: Normal rate and regular rhythm.     Heart sounds: No murmur.  Pulmonary:     Effort: Pulmonary effort is normal.     Breath sounds: Normal breath sounds. No wheezing, rhonchi or rales.  Neurological:     Mental Status: He is alert.  Psychiatric:        Mood and  Affect: Mood normal.        Behavior: Behavior normal.    UC Treatments / Results  Labs (all labs ordered are listed, but only abnormal results are displayed) Labs Reviewed  NOVEL CORONAVIRUS, NAA (HOSP ORDER, SEND-OUT TO REF LAB; TAT 18-24 HRS)    EKG   Radiology No results found.  Procedures Procedures (including critical care time)  Medications Ordered in UC Medications - No data to display  Initial Impression / Assessment and Plan / UC Course  I have reviewed the triage vital signs and the nursing notes.  Pertinent labs & imaging results that were available during my care of the patient were reviewed by me and considered in my medical decision making (see chart for details).    43 year old male presents with Covid exposure and desires testing today.  He is asymptomatic and doing well.  Awaiting results.  Final Clinical Impressions(s) / UC Diagnoses   Final diagnoses:  Exposure to COVID-19 virus     Discharge Instructions     Results available in 24-48 hours.  Rest.   Fluids.  If you have SOB go to the ER.  Take care  Dr. Lacinda Axon    ED Prescriptions    None     PDMP not reviewed this encounter.   Coral Spikes, [place] 04/02/19 1656

## 2019-04-02 NOTE — Discharge Instructions (Signed)
Results available in 24-48 hours.  Rest.   Fluids.  If you have SOB go to the ER.  Take care  Dr. Lacinda Axon

## 2019-04-03 LAB — NOVEL CORONAVIRUS, NAA (HOSP ORDER, SEND-OUT TO REF LAB; TAT 18-24 HRS): SARS-CoV-2, NAA: NOT DETECTED

## 2022-05-17 ENCOUNTER — Ambulatory Visit
Admission: EM | Admit: 2022-05-17 | Discharge: 2022-05-17 | Disposition: A | Discharge status: Home / Self Care | Payer: 59 | Attending: Family Medicine | Admitting: Family Medicine

## 2022-05-17 DIAGNOSIS — U071 COVID-19: Secondary | ICD-10-CM | POA: Diagnosis present

## 2022-05-17 LAB — RESP PANEL BY RT-PCR (RSV, FLU A&B, COVID)  RVPGX2
Influenza A by PCR: NEGATIVE
Influenza B by PCR: NEGATIVE
Resp Syncytial Virus by PCR: NEGATIVE
SARS Coronavirus 2 by RT PCR: POSITIVE — AB

## 2022-05-17 NOTE — ED Triage Notes (Signed)
Pt states he woke up this morning with weakness, pain behind eyes, cold chills, body aches in neck & shoulders

## 2022-05-17 NOTE — Discharge Instructions (Addendum)
Your home test for COVID-19 was positive, meaning that you were infected with the novel coronavirus and could give the germ to others.  Please continue isolation at home for at least 5 days since the start of your symptoms. Once you complete your 5 day quarantine, you may return to normal activities as long as you've not had a fever for over 24 hours(without taking fever reducing medicine) and your symptoms are improving. Be sure to wear a mask until Day 11.   If your were prescribed medication. Stop by the pharmacy to pick it up.   Please continue good preventive care measures, including:  frequent hand-washing, avoid touching your face, cover coughs/sneezes, stay out of crowds and keep a 6 foot distance from others.  Go to the nearest hospital emergency room if fever/cough/breathlessness are severe or illness seems like a threat to life.  You can take Tylenol and/or Ibuprofen as needed for fever reduction and pain relief.    For cough: honey 1/2 to 1 teaspoon (you can dilute the honey in water or another fluid).  You can also use guaifenesin and dextromethorphan for cough. You can use a humidifier for chest congestion and cough.  If you don't have a humidifier, you can sit in the bathroom with the hot shower running.      For sore throat: try warm salt water gargles, Mucinex sore throat cough drops or cepacol lozenges, throat spray, warm tea or water with lemon/honey, popsicles or ice, or OTC cold relief medicine for throat discomfort. You can also purchase chloraseptic spray at the pharmacy or dollar store.   For congestion: take a daily anti-histamine like Zyrtec, Claritin, and a oral decongestant, such as pseudoephedrine.  You can also use Flonase 1-2 sprays in each nostril daily. Afrin is also a good option, if you do not have high blood pressure.    It is important to stay hydrated: drink plenty of fluids (water, gatorade/powerade/pedialyte, juices, or teas) to keep your throat moisturized and  help further relieve irritation/discomfort.    Return or go to the Emergency Department if symptoms worsen or do not improve in the next few days  

## 2022-05-17 NOTE — ED Provider Notes (Signed)
[facility]    CSN: [id number] Arrival date & time: 05/17/22  0818      History   Chief Complaint Chief Complaint  Patient presents with   Weakness   Generalized Body Aches    HPI Jared Carrillo is a 47 y.o. male.   HPI   Jared Carrillo presents for body aches, generalized weakness, chills after waking up this morning. Has headache "behind my eyeballs." Feels chest tightness when he breaths in. The night sweats are not new. Has been sneezing.  No unexplained weight loss. He smokes cigarettes. No recent sick contacts.    Fever: undocumented  Chills: yes Sore throat: no   Cough: yes Sputum: dry Nasal congestion: no Rhinorrhea: yes Myalgias: yes Appetite: normal  Hydration: normal  Abdominal pain: no Nausea: no Vomiting: no Diarrhea: No Chest pain: with breathing Rash: no Sleep disturbance: no Headache: yes      History reviewed. No pertinent past medical history.  There are no problems to display for this patient.   History reviewed. No pertinent surgical history.     Home Medications    Prior to Admission medications   Not on File    Family History Family History  Problem Relation Age of Onset   Hypertension Mother     Social History Social History   Tobacco Use   Smoking status: Every Day    Types: Cigarettes   Smokeless tobacco: Never  Vaping Use   Vaping Use: Never used  Substance Use Topics   Alcohol use: Yes   Drug use: No     Allergies   Patient has no known allergies.   Review of Systems Review of Systems: negative unless otherwise stated in HPI.      Physical Exam Triage Vital Signs ED Triage Vitals  Enc Vitals Group     BP 05/17/22 0841 136/70     Pulse Rate 05/17/22 0841 (!) 101     Resp --      Temp 05/17/22 0841 100.2 F (37.9 C)     Temp Source 05/17/22 0841 Oral     SpO2 05/17/22 0841 97 %     Weight 05/17/22 0839 140 lb 12.8 oz (63.9 kg)     Height 05/17/22 0839 5\' 8"  (1.727 m)     Head  Circumference --      Peak Flow --      Pain Score 05/17/22 0839 1     Pain Loc --      Pain Edu? --      Excl. in [place]? --    No data found.  Updated Vital Signs BP 136/70 (BP Location: Left Arm)   Pulse (!) 101   Temp 100.2 F (37.9 C) (Oral)   Ht 5\' 8"  (1.727 m)   Wt 63.9 kg   SpO2 97%   BMI 21.41 kg/m   Visual Acuity Right Eye Distance:   Left Eye Distance:   Bilateral Distance:    Right Eye Near:   Left Eye Near:    Bilateral Near:     Physical Exam GEN:     alert, non-toxic appearing male in no distress    HENT:  mucus membranes moist, oropharyngeal without erythema, lesions or exudate, no tonsillar hypertrophy, clear nasal discharge, bilateral TM normal EYES:   pupils equal and reactive, no scleral injection or discharge NECK:  normal ROM, no meningismus   RESP:  no increased work of breathing, clear to auscultation bilaterally CVS:   regular rate and rhythm  Skin:   warm and dry, no rash on visible skin    UC Treatments / Results  Labs (all labs ordered are listed, but only abnormal results are displayed) Labs Reviewed  RESP PANEL BY RT-PCR (RSV, FLU A&B, COVID)  [id number] - Abnormal; Notable for the following components:      Result Value   SARS Coronavirus 2 by RT PCR POSITIVE (*)    All other components within normal limits    EKG   Radiology No results found.  Procedures Procedures (including critical care time)  Medications Ordered in UC Medications - No data to display  Initial Impression / Assessment and Plan / UC Course  I have reviewed the triage vital signs and the nursing notes.  Pertinent labs & imaging results that were available during my care of the patient were reviewed by me and considered in my medical decision making (see chart for details).       Pt is a 47 y.o. male who presents for 1 day of respiratory symptoms. [name] is afebrile here without recent antipyretics. Satting well on room air. Overall pt is non-toxic  appearing, well hydrated, without respiratory distress. Pulmonary exam is unremarkable.  COVID and influenza testing obtained. History most consistent with viral respiratory illness. COVID test is positive.  He reports he has never had COVID before.  Discussed COVID duration of symptoms, isolation and quarantine procedure.  Discussed symptomatic treatment.  He has been in the treatment window but he is not wanting to do Paxlovid at this time.  Typical duration of symptoms discussed.    Return and ED precautions given and voiced understanding. Discussed MDM, treatment plan and plan for follow-up with patient who agrees with plan.     Final Clinical Impressions(s) / UC Diagnoses   Final diagnoses:  [id number]     Discharge Instructions      Your home test for COVID-19 was positive, meaning that you were infected with the novel coronavirus and could give the germ to others.  Please continue isolation at home for at least 5 days since the start of your symptoms. Once you complete your 5 day quarantine, you may return to normal activities as long as you've not had a fever for over 24 hours(without taking fever reducing medicine) and your symptoms are improving. Be sure to wear a mask until Day 11.   If your were prescribed medication. Stop by the pharmacy to pick it up.   Please continue good preventive care measures, including:  frequent hand-washing, avoid touching your face, cover coughs/sneezes, stay out of crowds and keep a 6 foot distance from others.  Go to the nearest hospital emergency room if fever/cough/breathlessness are severe or illness seems like a threat to life.  You can take Tylenol and/or Ibuprofen as needed for fever reduction and pain relief.    For cough: honey 1/2 to 1 teaspoon (you can dilute the honey in water or another fluid).  You can also use guaifenesin and dextromethorphan for cough. You can use a humidifier for chest congestion and cough.  If you don't have a  humidifier, you can sit in the bathroom with the hot shower running.      For sore throat: try warm salt water gargles, Mucinex sore throat cough drops or cepacol lozenges, throat spray, warm tea or water with lemon/honey, popsicles or ice, or OTC cold relief medicine for throat discomfort. You can also purchase chloraseptic spray at the pharmacy or dollar store.   For congestion: take  a daily anti-histamine like Zyrtec, Claritin, and a oral decongestant, such as pseudoephedrine.  You can also use Flonase 1-2 sprays in each nostril daily. Afrin is also a good option, if you do not have high blood pressure.    It is important to stay hydrated: drink plenty of fluids (water, gatorade/powerade/pedialyte, juices, or teas) to keep your throat moisturized and help further relieve irritation/discomfort.    Return or go to the Emergency Department if symptoms worsen or do not improve in the next few days      ED Prescriptions   None    PDMP not reviewed this encounter.   Lyndee Hensen, DO 05/17/22 1808
